# Patient Record
Sex: Male | Born: 1985 | Race: White | Hispanic: No | Marital: Single | Smoking: Former smoker
Health system: Southern US, Community
[De-identification: ages and names within clinical notes are randomized; demographics above are authoritative.]

## PROBLEM LIST (undated history)

## (undated) DIAGNOSIS — F1120 Opioid dependence, uncomplicated: Secondary | ICD-10-CM

## (undated) HISTORY — DX: Opioid dependence, uncomplicated: F11.20

---
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
## 2006-03-08 ENCOUNTER — Emergency Department (HOSPITAL_COMMUNITY): Admission: EM | Admit: 2006-03-08 | Discharge: 2006-03-08 | Payer: Self-pay | Admitting: Emergency Medicine

## 2006-03-11 ENCOUNTER — Emergency Department (HOSPITAL_COMMUNITY): Admission: EM | Admit: 2006-03-11 | Discharge: 2006-03-11 | Payer: Self-pay | Admitting: Emergency Medicine

## 2006-10-28 ENCOUNTER — Emergency Department (HOSPITAL_COMMUNITY): Admission: EM | Admit: 2006-10-28 | Discharge: 2006-10-28 | Payer: Self-pay | Admitting: Emergency Medicine

## 2006-12-16 ENCOUNTER — Emergency Department (HOSPITAL_COMMUNITY): Admission: EM | Admit: 2006-12-16 | Discharge: 2006-12-16 | Payer: Self-pay | Admitting: Family Medicine

## 2009-05-28 IMAGING — CR DG THORACIC SPINE 2V
3 series · 3 of 3 positions shown · non-contrast
Comparison: none

HISTORY: Back pain after lifting heavy box

THORACIC SPINE 3 VIEWS:
12 pairs of ribs.
Vertebral body and disc space heights maintained.
No fracture or subluxation.
Bone mineralization normal.
Paraspinal lines normal.

[view not recorded (1 of 3)]
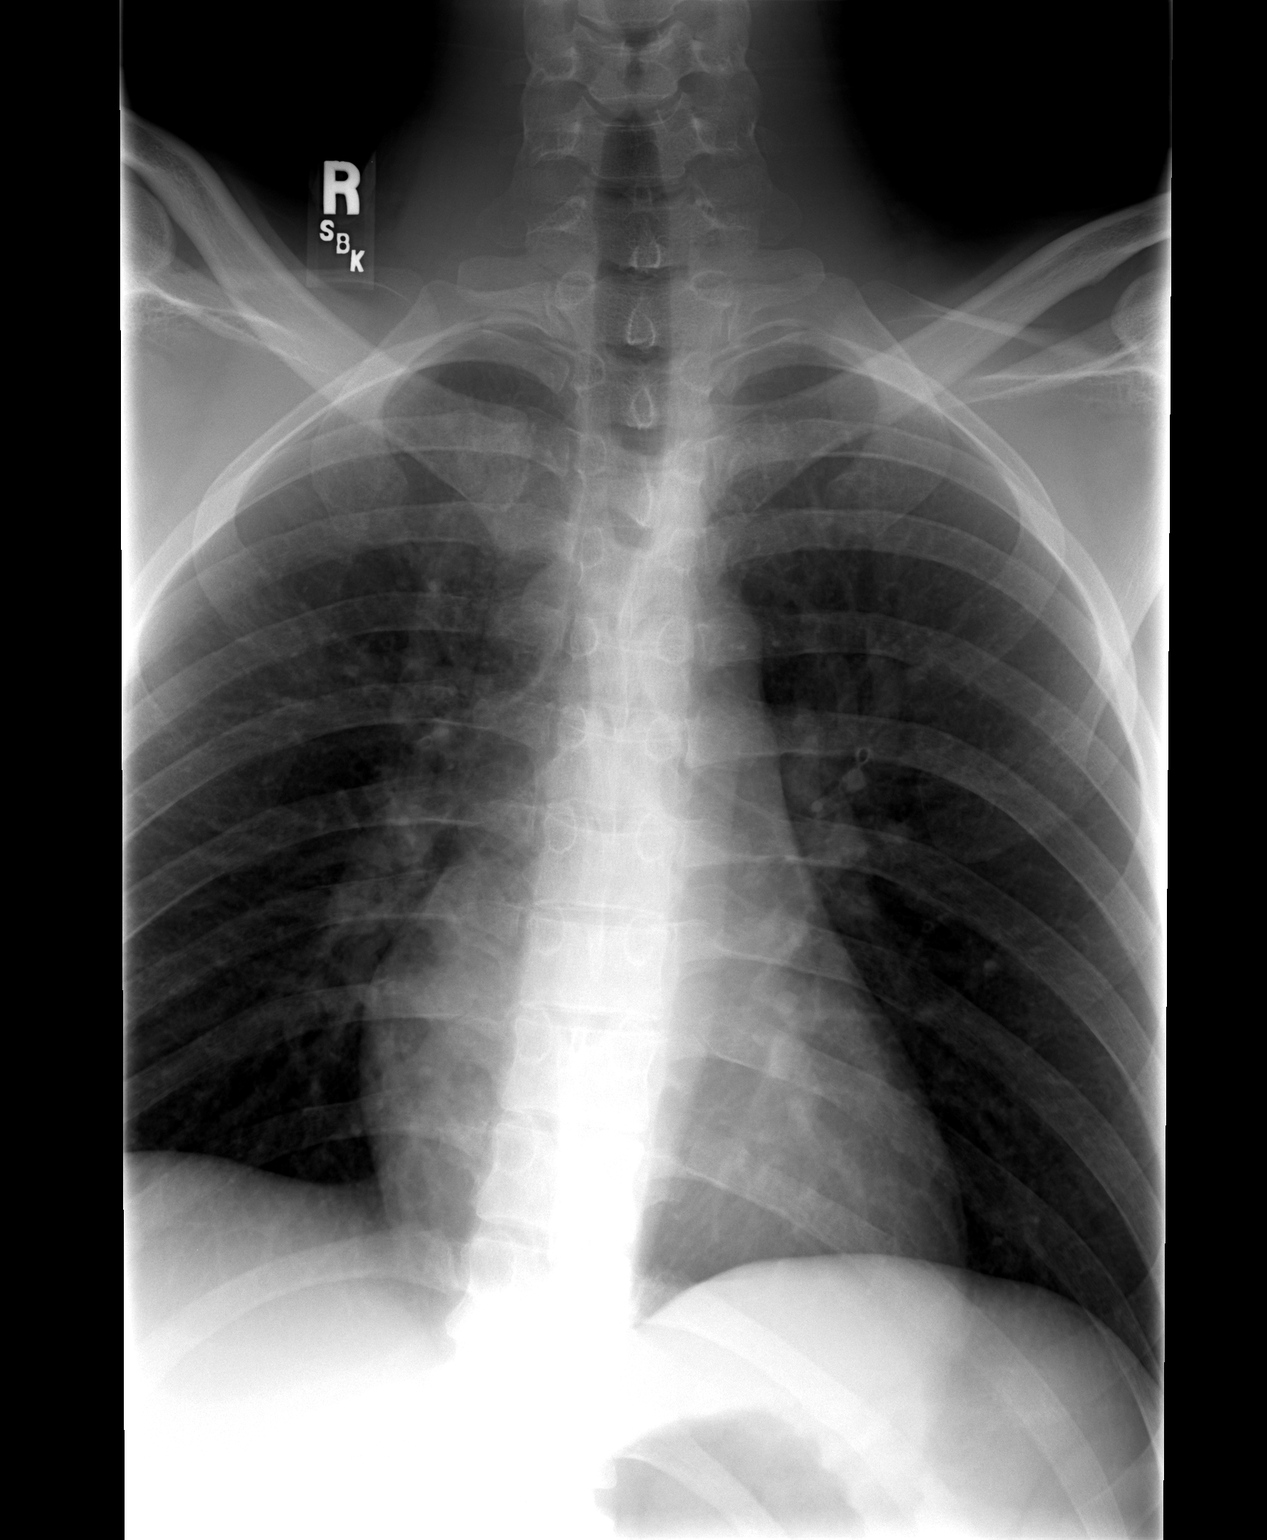

[view not recorded (2 of 3)]
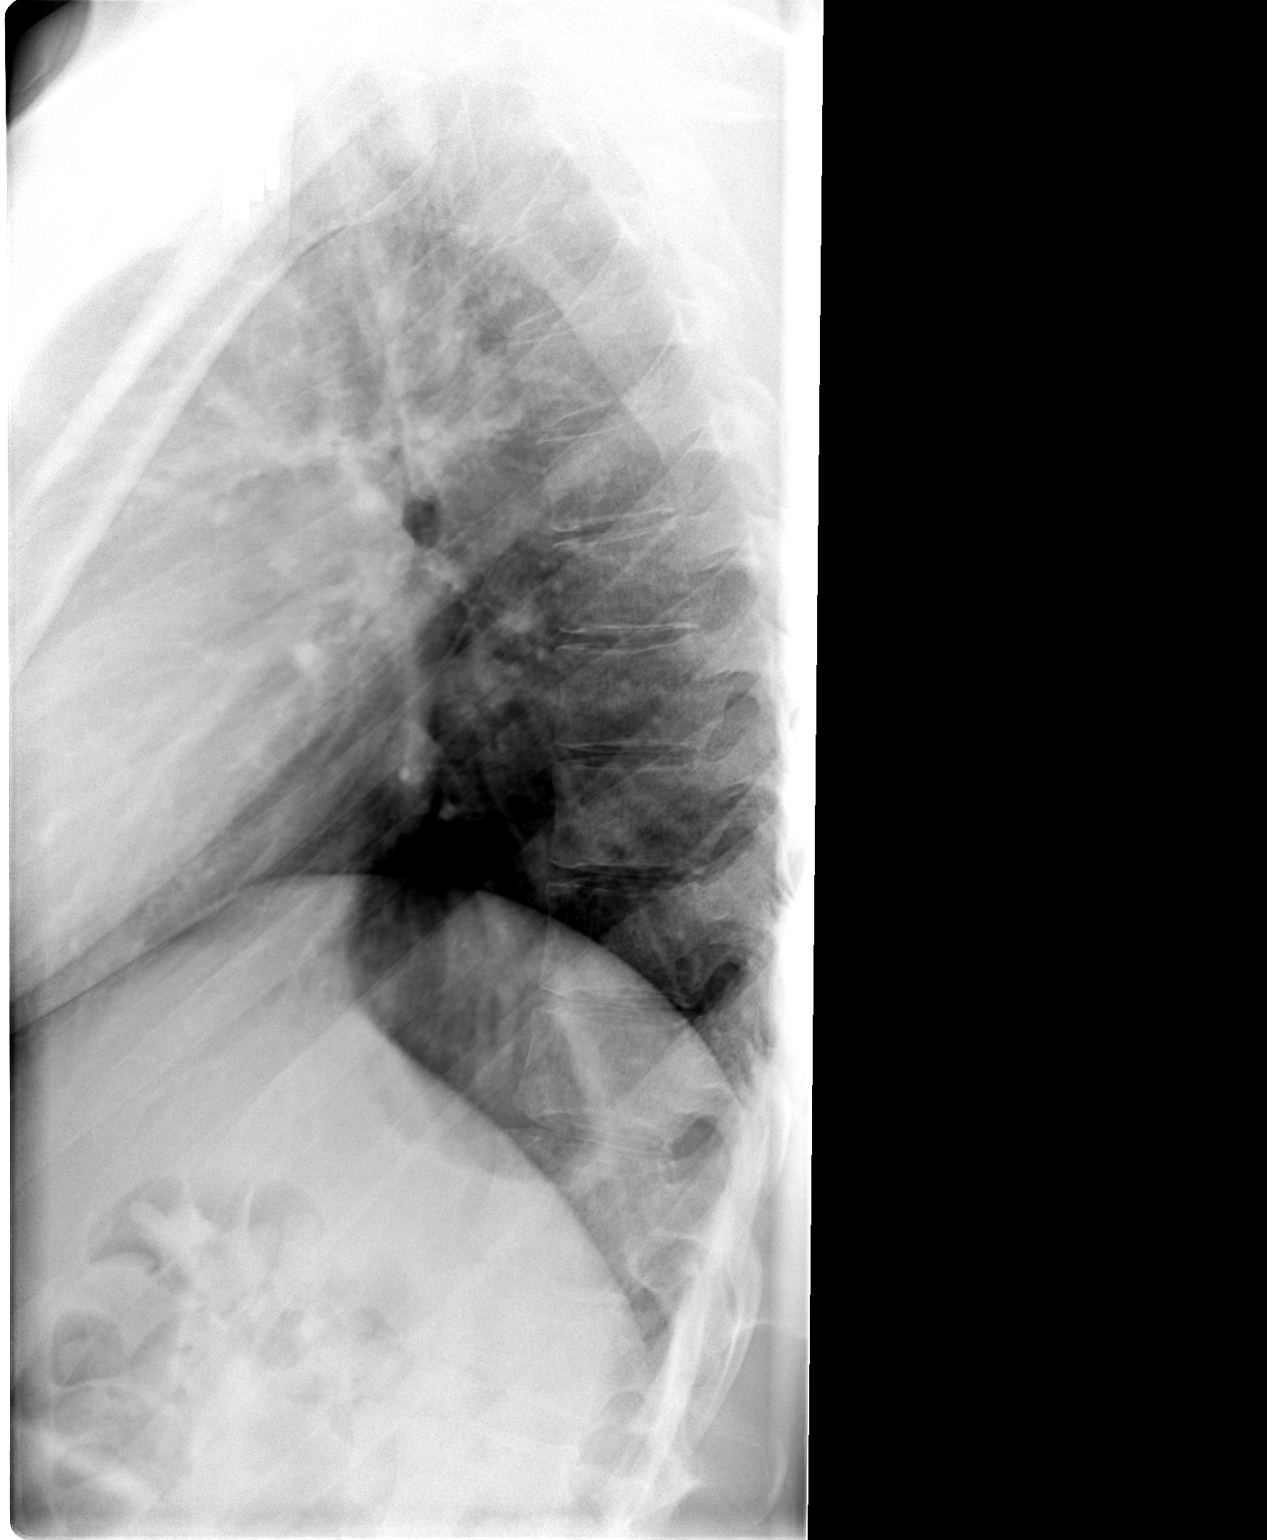

[view not recorded (3 of 3)]
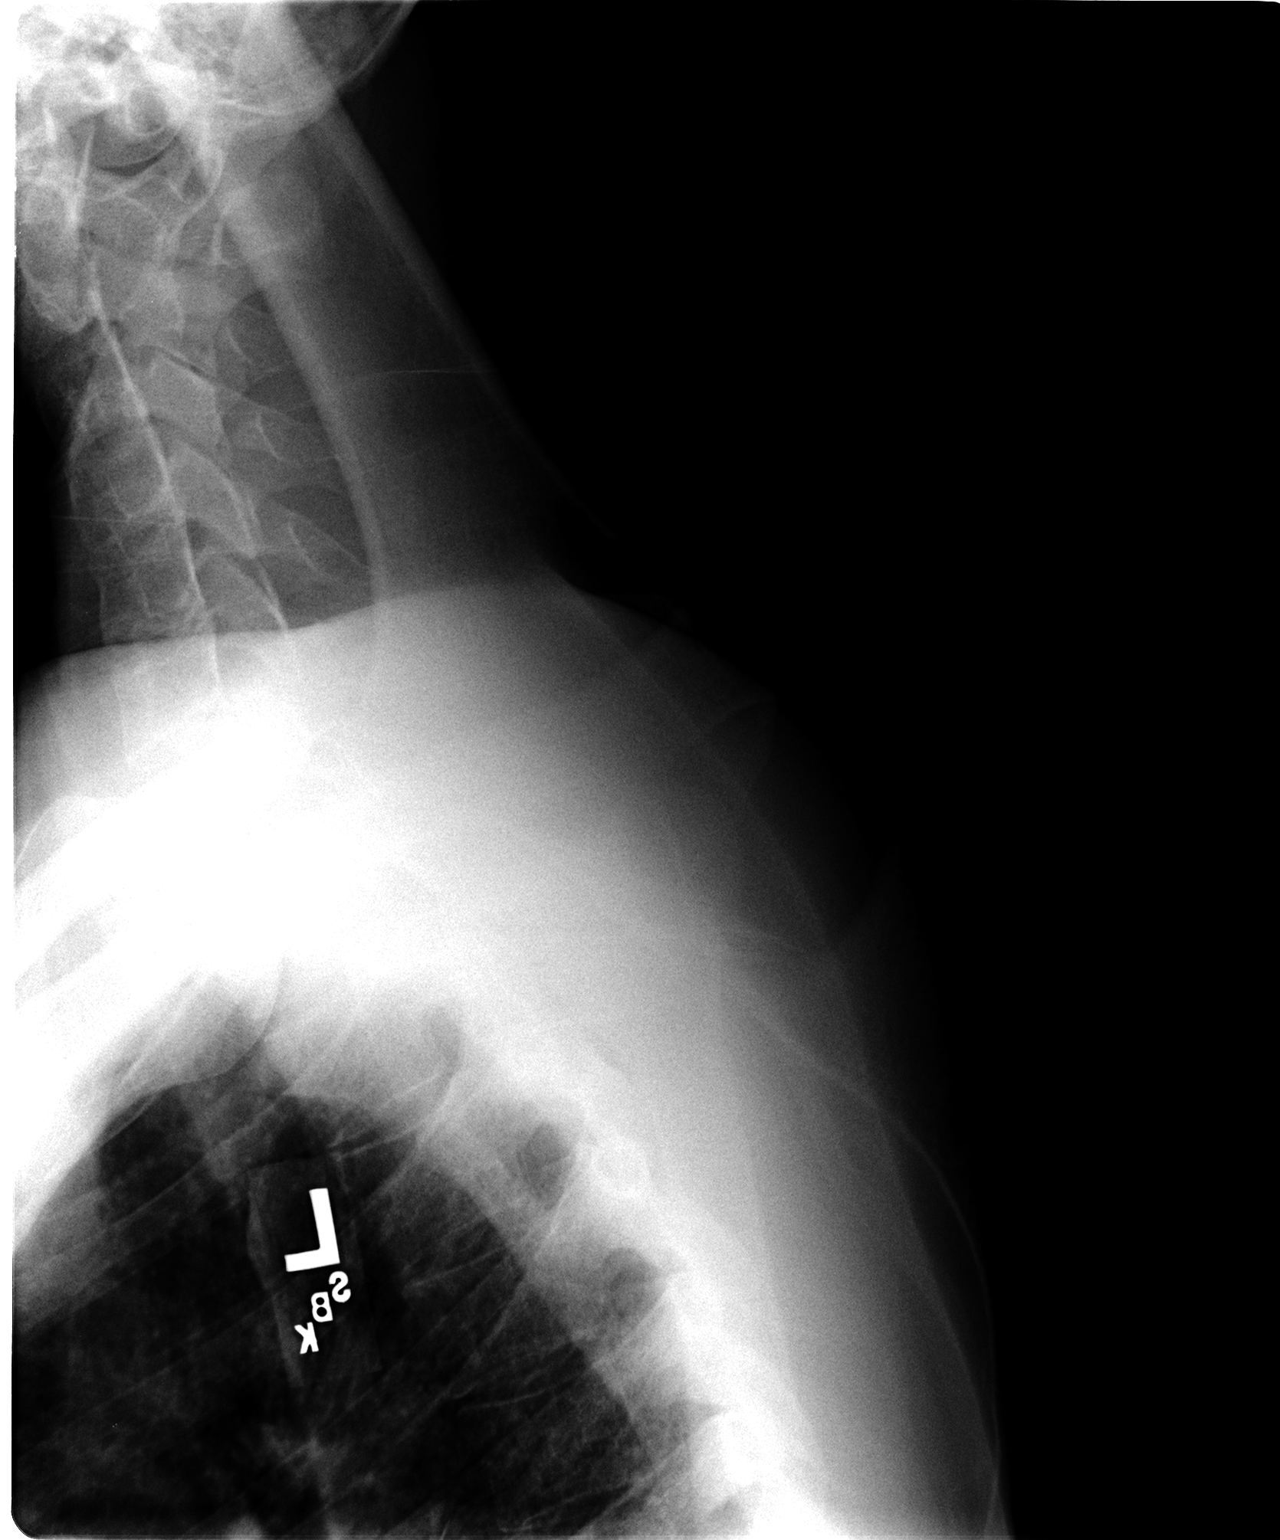

[3 of 3 positions shown; findings below may reference images not displayed]

IMPRESSION: No acute bony abnormality.

## 2011-02-28 LAB — STREP A DNA PROBE: Group A Strep Probe: NEGATIVE

## 2011-02-28 LAB — RAPID STREP SCREEN (MED CTR MEBANE ONLY): Streptococcus, Group A Screen (Direct): NEGATIVE

## 2012-06-24 ENCOUNTER — Encounter (HOSPITAL_COMMUNITY): Payer: Self-pay | Admitting: Emergency Medicine

## 2012-06-24 ENCOUNTER — Emergency Department (HOSPITAL_COMMUNITY)
Admission: EM | Admit: 2012-06-24 | Discharge: 2012-06-24 | Disposition: A | Discharge status: Home / Self Care | Payer: Self-pay | Attending: Emergency Medicine | Admitting: Emergency Medicine

## 2012-06-24 ENCOUNTER — Emergency Department (HOSPITAL_COMMUNITY): Payer: Self-pay

## 2012-06-24 DIAGNOSIS — Y929 Unspecified place or not applicable: Secondary | ICD-10-CM | POA: Insufficient documentation

## 2012-06-24 DIAGNOSIS — S61409A Unspecified open wound of unspecified hand, initial encounter: Secondary | ICD-10-CM | POA: Insufficient documentation

## 2012-06-24 DIAGNOSIS — W260XXA Contact with knife, initial encounter: Secondary | ICD-10-CM | POA: Insufficient documentation

## 2012-06-24 DIAGNOSIS — Z87891 Personal history of nicotine dependence: Secondary | ICD-10-CM | POA: Insufficient documentation

## 2012-06-24 DIAGNOSIS — Z23 Encounter for immunization: Secondary | ICD-10-CM | POA: Insufficient documentation

## 2012-06-24 DIAGNOSIS — Y9389 Activity, other specified: Secondary | ICD-10-CM | POA: Insufficient documentation

## 2012-06-24 DIAGNOSIS — IMO0002 Reserved for concepts with insufficient information to code with codable children: Secondary | ICD-10-CM

## 2012-06-24 MED ORDER — MELOXICAM 15 MG PO TABS: 15.0000 mg | ORAL_TABLET | Freq: Every day | ORAL | Status: DC

## 2012-06-24 MED ORDER — TETANUS-DIPHTH-ACELL PERTUSSIS 5-2.5-18.5 LF-MCG/0.5 IM SUSP
0.5000 mL | Freq: Once | INTRAMUSCULAR | Status: AC
  Administered 2012-06-24: 0.5 mL via INTRAMUSCULAR
  Filled 2012-06-24: qty 0.5

## 2012-06-24 NOTE — ED Provider Notes (Signed)
History     CSN: [id number]  Arrival date & time 06/24/12  1122   First MD Initiated Contact with Patient 06/24/12 1133      Chief Complaint  Patient presents with  . Extremity Laceration    (Consider location/radiation/quality/duration/timing/severity/associated sxs/prior treatment) HPI  27 year old male who presents the emergency department with chief complaint of laceration to left hand.  He is right-hand dominant.  Patient states he he was handling a knife today when his brother reached to pull the knife away. Knife new and uncontaminated. Patient ended up getting caught in  webbing between the first and second left digit.  He is not up-to-date on his tetanus vaccination.  Patient complains of pain.  He is able to move all of his digits.  He denies any numbness.  History reviewed. No pertinent past medical history.  History reviewed. No pertinent past surgical history.  History reviewed. No pertinent family history.  History  Substance Use Topics  . Smoking status: Former Games developer  . Smokeless tobacco: Not on file  . Alcohol Use: No      Review of Systems Ten systems reviewed and are negative for acute change, except as noted in the HPI.   Allergies  Review of patient's allergies indicates no known allergies.  Home Medications   Current Outpatient Rx  Name  Route  Sig  Dispense  Refill  . methadone (DOLOPHINE) 10 MG/5ML solution   Oral   Take 140 mg by mouth every morning.           BP 132/82  Pulse 98  Temp(Src) 98 F (36.7 C) (Oral)  Resp 18  SpO2 100%  Physical Exam  Physical Exam  Nursing note and vitals reviewed. Constitutional: He appears well-developed and well-nourished. No distress.  HENT:  Head: Normocephalic and atraumatic.  Eyes: Conjunctivae normal are normal. No scleral icterus.  Neck: Normal range of motion. Neck supple.  Cardiovascular: Normal rate, regular rhythm and normal heart sounds.   Pulmonary/Chest: Effort normal and breath  sounds normal. No respiratory distress.  Abdominal: Soft. There is no tenderness.  Musculoskeletal: He exhibits no edema.  Neurological: He is alert.  Skin: Skin is warm and dry. He is not diaphoretic.  3 cm horseshoe-shaped laceration with mild avulsion  In the webbing between fingers 1 and 2 of the left hand.no tendon involvement.  Full-strength in all fingers.  No limited range of motion Neurovascularly intact.   Psychiatric: His behavior is normal.    ED Course  Procedures (including critical care time) LACERATION REPAIR Performed by: Arthor Captain Authorized by: Arthor Captain Consent: Verbal consent obtained. Risks and benefits: risks, benefits and alternatives were discussed Consent given by: patient Patient identity confirmed: provided demographic data Prepped and Draped in normal sterile fashion Wound explored  Laceration Location: left hand  Laceration Length: 3 cm  No Foreign Bodies seen or palpated  Anesthesia: local infiltration  Local anesthetic: lidocaine 2 % w/o epinephrine  Anesthetic total: 5 ml  Irrigation method: syringe Amount of cleaning: standard  Skin closure: 4.0 prolene     Number of sutures: 11  Technique: RI, SI  Patient tolerance: Patient tolerated the procedure well with no immediate complications.  Labs Reviewed - No data to display Dg Hand Complete Left  06/24/2012  *RADIOLOGY REPORT*  Clinical Data: Hand laceration.  Evaluate for foreign body.  LEFT HAND - COMPLETE 3+ VIEW  Comparison: None.  Findings: No acute bony abnormality.  Specifically, no fracture, subluxation, or dislocation.  Soft tissues are  intact.  No radiopaque foreign body.  IMPRESSION: No acute bony abnormality.   Original Report Authenticated By: Charlett Nose, M.D.      1. Laceration       MDM  Tdap booster given.Pressure irrigation performed. Laceration occurred < 8 hours prior to repair which was well tolerated. Pt has no co morbidities to effect normal wound  healing. Discussed suture home care w pt and answered questions. Pt to f-u for wound check and suture removal in 8 days. Pt is hemodynamically stable w no complaints prior to dc.           Arthor Captain, PA-C 06/27/12 1610

## 2012-06-24 NOTE — ED Notes (Signed)
Pt and brother were playing with a knife.  Pt cut left pointer finger "to the bone".

## 2012-06-25 NOTE — Progress Notes (Signed)
During [facility] ED 06/24/12 visit pt was seen by [facility] liaison  Pt offered services to assist with finding a [place] self pay provider, resources & health reform information  

## 2012-06-27 NOTE — ED Provider Notes (Signed)
Medical screening examination/treatment/procedure(s) were conducted as a shared visit with non-physician practitioner(s) and myself.  I personally evaluated the patient during the encounter.  No obvious tendon injury. Full range of motion of hand  Donnetta Hutching, MD 06/27/12 2044

## 2012-07-03 ENCOUNTER — Emergency Department (HOSPITAL_COMMUNITY)
Admission: EM | Admit: 2012-07-03 | Discharge: 2012-07-03 | Disposition: A | Discharge status: Home / Self Care | Payer: Self-pay | Attending: Emergency Medicine | Admitting: Emergency Medicine

## 2012-07-03 ENCOUNTER — Encounter (HOSPITAL_COMMUNITY): Payer: Self-pay | Admitting: Emergency Medicine

## 2012-07-03 DIAGNOSIS — Z87891 Personal history of nicotine dependence: Secondary | ICD-10-CM | POA: Insufficient documentation

## 2012-07-03 DIAGNOSIS — Z4801 Encounter for change or removal of surgical wound dressing: Secondary | ICD-10-CM | POA: Insufficient documentation

## 2012-07-03 DIAGNOSIS — Z79899 Other long term (current) drug therapy: Secondary | ICD-10-CM | POA: Insufficient documentation

## 2012-07-03 DIAGNOSIS — Z4802 Encounter for removal of sutures: Secondary | ICD-10-CM

## 2012-07-03 DIAGNOSIS — Z87828 Personal history of other (healed) physical injury and trauma: Secondary | ICD-10-CM | POA: Insufficient documentation

## 2012-07-03 NOTE — ED Provider Notes (Signed)
History    This chart was scribed for non-physician practitioner working with Ward Givens, MD by Leone Payor, ED Scribe. This patient was seen in room WTR8/WTR8 and the patient's care was started at 1645.   CSN: [id number]  Arrival date & time 07/03/12  1645   First MD Initiated Contact with Patient 07/03/12 1717      Chief Complaint  Patient presents with  . Suture / Staple Removal     Patient is a 27 y.o. male presenting with suture removal. The history is provided by the patient. No language interpreter was used.  Suture / Staple Removal  The sutures were placed 7 to 10 days ago. Treatments since wound repair include antibiotic ointment use. There has been no drainage from the wound. There is no redness present. There is no swelling present. The pain has improved. He has no difficulty moving the affected extremity or digit.    SPIRO AUSBORN is a 27 y.o. male who presents to the Emergency Department requesting suture removal today. Pt reports the laceration occurred 8 days ago via a knife cut for which he had 7 stitches placed. Reports taking tylenol and meloxicam for pain as needed. Reports using neosporin. He has associated aching to the affected area but drainage. He denies fever, nausea.   Pt is a former smoker but denies alcohol use.  History reviewed. No pertinent past medical history.  History reviewed. No pertinent past surgical history.  No family history on file.  History  Substance Use Topics  . Smoking status: Former Games developer  . Smokeless tobacco: Not on file  . Alcohol Use: No      Review of Systems  Constitutional: Negative for fever.  Gastrointestinal: Negative for nausea.  Skin: Positive for wound (healing laceration).  All other systems reviewed and are negative.    Allergies  Review of patient's allergies indicates no known allergies.  Home Medications   Current Outpatient Rx  Name  Route  Sig  Dispense  Refill  . acetaminophen (TYLENOL) 500 MG  tablet   Oral   Take 1,000 mg by mouth every 6 (six) hours as needed for pain.         . meloxicam (MOBIC) 15 MG tablet   Oral   Take 1 tablet (15 mg total) by mouth daily.   10 tablet   0   . methadone (DOLOPHINE) 10 MG/5ML solution   Oral   Take 140 mg by mouth every morning.           BP 116/69  Pulse 99  Temp(Src) 98.4 F (36.9 C) (Oral)  SpO2 100%  Physical Exam  Nursing note and vitals reviewed. Constitutional: He is oriented to person, place, and time. He appears well-developed and well-nourished. No distress.  HENT:  Head: Normocephalic and atraumatic.  Eyes: EOM are normal.  Neck: Neck supple. No tracheal deviation present.  Cardiovascular: Normal rate.   Pulmonary/Chest: Effort normal. No respiratory distress.  Musculoskeletal: Normal range of motion.  Neurological: He is alert and oriented to person, place, and time.  Skin: Skin is warm and dry.  Good sensation and movement, flexor and extensor, to the left hand.   Psychiatric: He has a normal mood and affect. His behavior is normal.    ED Course  Procedures (including critical care time)  DIAGNOSTIC STUDIES: Oxygen Saturation is 100% on room air, normal by my interpretation.    COORDINATION OF CARE: 5:46 PM Discussed discharge plan with pt and pt agreed to  plan. Also advised pt to follow up and pt agreed.     Labs Reviewed - No data to display No results found.   No diagnosis found.     MDM  Suture removal.  Steri strips applied with bandaid.  No infection noted.     I personally performed the services described in this documentation, which was scribed in my presence. The recorded information has been reviewed and is accurate.    Remi Haggard, NP 07/04/12 [phone]

## 2012-07-03 NOTE — ED Notes (Signed)
MD at bedside. 

## 2012-07-03 NOTE — ED Notes (Signed)
Pt states "I here for my suture removal"

## 2012-07-03 NOTE — Discharge Instructions (Signed)
Erik Cox we took the sutures out today with no problem.  You can wear the steri strips as needed.  Use antibacterial ointment as well.  Return to the ER for severe pain or signs of infection.   Suture Removal Your caregiver has removed your sutures today. If skin adhesive strips were applied at the time of suturing, or applied following removal of the sutures today, they will begin to peel off in a couple more days. If skin adhesive strips remain after 14 days, they may be removed. HOME CARE INSTRUCTIONS   Change any bandages (dressings) at least once a day or as directed by your caregiver. If the bandage sticks, soak it off with warm, soapy water.  Wash the area with soap and water to remove all the cream or ointment (if you were instructed to use any) 2 times a day. Rinse off the soap and pat the area dry with a clean towel.  Reapply cream or ointment as directed by your caregiver. This will help prevent infection and keep the bandage from sticking.  Keep the wound area dry and clean. If the bandage becomes wet, dirty, or develops a bad smell, change it as soon as possible.  Only take over-the-counter or prescription medicines for pain, discomfort, or fever as directed by your caregiver.  Use sunscreen when out in the sun. New scars become sunburned easily.  Return to your caregivers office in in 7 days or as directed to have your sutures removed. You may need a tetanus shot if:  You cannot remember when you had your last tetanus shot.  You have never had a tetanus shot.  The injury broke your skin. If you got a tetanus shot, your arm may swell, get red, and feel warm to the touch. This is common and not a problem. If you need a tetanus shot and you choose not to have one, there is a rare chance of getting tetanus. Sickness from tetanus can be serious. SEEK IMMEDIATE MEDICAL CARE IF:   There is redness, swelling, or increasing pain in the wound.  Pus is coming from the wound.  An  unexplained oral temperature above 102 F (38.9 C) develops.  You notice a bad smell coming from the wound or dressing.  The wound breaks open (edges not staying together) after sutures have been removed. Document Released: 01/23/2001 Document Revised: 07/23/2011 Document Reviewed: 03/24/2007 [facility] Patient Information 2013 [place], [place].

## 2012-07-06 NOTE — ED Provider Notes (Signed)
Medical screening examination/treatment/procedure(s) were performed by non-physician practitioner and as supervising physician I was immediately available for consultation/collaboration.  Juliet Rude. Rubin Payor, MD 07/06/12 2248

## 2012-07-21 ENCOUNTER — Ambulatory Visit: Payer: Self-pay | Admitting: Internal Medicine

## 2012-07-21 VITALS — BP 152/82 | HR 121 | Temp 98.0°F | Resp 16 | Ht 73.0 in | Wt 248.0 lb

## 2012-07-21 DIAGNOSIS — R1013 Epigastric pain: Secondary | ICD-10-CM

## 2012-07-21 DIAGNOSIS — K921 Melena: Secondary | ICD-10-CM

## 2012-07-21 DIAGNOSIS — K5900 Constipation, unspecified: Secondary | ICD-10-CM

## 2012-07-21 DIAGNOSIS — F1120 Opioid dependence, uncomplicated: Secondary | ICD-10-CM

## 2012-07-21 DIAGNOSIS — R55 Syncope and collapse: Secondary | ICD-10-CM

## 2012-07-21 LAB — POCT CBC
Hemoglobin: 14.1 g/dL (ref 14.1–18.1)
Lymph, poc: 2.2 (ref 0.6–3.4)
MCH, POC: 29.1 pg (ref 27–31.2)
MCHC: 31.8 g/dL (ref 31.8–35.4)
MID (cbc): 0.6 (ref 0–0.9)
MPV: 9.8 fL (ref 0–99.8)
POC Granulocyte: 10.7 — AB (ref 2–6.9)
POC LYMPH PERCENT: 16.6 %L (ref 10–50)
POC MID %: 4.5 %M (ref 0–12)
Platelet Count, POC: 327 10*3/uL (ref 142–424)
RDW, POC: 12.7 %
WBC: 13.5 10*3/uL — AB (ref 4.6–10.2)

## 2012-07-21 MED ORDER — HYDROCORTISONE 2.5 % RE CREA: TOPICAL_CREAM | Freq: Two times a day (BID) | RECTAL | Status: DC

## 2012-07-21 NOTE — Patient Instructions (Signed)
Your diagnosis for today is vasovagal syncope -Google this and read about it  This is a fairly common response to straining and having pain  Continue an aggressive dietary approach to constipation Use Miralax if needed anusol hc prescription for fissures Probiotics daily Exercise daily-one hour Avoid foods that are white Consider Zantac twice a day for 6 weeks for your dyspepsia/indigestion

## 2012-07-21 NOTE — Progress Notes (Signed)
  Subjective:    Patient ID: Erik Cox, male    DOB: 1986/01/12, 27 y.o.   MRN: [id number]  HPI 27 year old male presents with:  Constipation x 8 days; states his medication(methodone) causes some constipation; BM every 4 days is typical Took stool softener last night; prune juice//his own fiber  Went to use toilet today and next thing he knows "woke up on the floor".  Hit head on wall. loss of consciousness, does not remember how long he was unconscious Took about 20 seconds to get back onto toilet; he had accomplished a bowel movement ; wiped- "black sludge" presently feels a little dizzy, a little nauseous; mild abdominal discomfort ; bowel rectal discomfort --history of occasional bright red blood after straining for the past 2 months he feels some tenesmus/  History of indigestion x 4 months; doesn't take a lot of ibuprofen; no coffee; no alcohol; uses lots of tums//lots of indigestion/occasionally wakes at night with epigastric burning    Review of Systems  no genitourinary problems/symptoms No cardiac or pulmonary problems/symptoms/     Objective:   Physical Exam BP 152/82  Pulse 121  Temp(Src) 98 F (36.7 C) (Oral)  Resp 16  Ht 6\' 1"  (1.854 m)  Wt 248 lb (112.492 kg)  BMI 32.73 kg/m2  SpO2 99% Very anxious Heart regular with a rate of 105/slows to the 80s if he deep breathes and and relaxes Lungs clear Abdomen soft /bowel sounds very active /no hepatosplenomegaly /tender to deep palpation left lower quadrant right lower quadrant and left upper quadrant without masses /negative rebound   rectal exam with a healed fissure at 12:00 Digital exam reveals no masses/stool brown Hemosure= positive Results for orders placed in visit on 07/21/12  IFOBT (OCCULT BLOOD)      Result Value Range   IFOBT Positive    POCT CBC      Result Value Range   WBC 13.5 (*) 4.6 - 10.2 K/uL   Lymph, poc 2.2  0.6 - 3.4   POC LYMPH PERCENT 16.6  10 - 50 %L   MID (cbc) 0.6  0 - 0.9   POC  MID % 4.5  0 - 12 %M   POC Granulocyte 10.7 (*) 2 - 6.9   Granulocyte percent 78.9  37 - 80 %G   RBC 4.85  4.69 - 6.13 M/uL   Hemoglobin 141 (*) 14.1 - 18.1 g/dL   HCT, POC 69.6  29.5 - 53.7 %   MCV 91.6  80 - 97 fL   MCH, POC 29.1  27 - 31.2 pg   MCHC 31.8  31.8 - 35.4 g/dL   RDW, POC 28.4     Platelet Count, POC 327  142 - 424 K/uL   MPV 9.8  0 - 99.8 fL        Assessment & Plan:  Problem #1 chronic constipation Problem #2 vasovagal syncope Problem #3 healing anal fissure Problem #4 methadone maintenance Problem #5 dyspepsia postprandial/? Ulcer/? Gastritis/? Irritable bowel syndrome  Meds ordered this encounter  Medications  . hydrocortisone (ANUSOL-HC) 2.5 % rectal cream    Sig: Place rectally 2 (two) times daily. For fissure    Dispense:  30 g    Refill:  2    -  OTC Prilosec 40 for next month and then recheck   -  Probiotics 3 month   -  MiraLax when necessary/improved diet/daily fiber/increase exercise/weight loss

## 2012-08-18 ENCOUNTER — Ambulatory Visit: Payer: Self-pay | Admitting: Emergency Medicine

## 2012-08-18 VITALS — BP 122/72 | HR 106 | Temp 98.1°F | Resp 16 | Ht 72.25 in | Wt 243.6 lb

## 2012-08-18 DIAGNOSIS — R109 Unspecified abdominal pain: Secondary | ICD-10-CM

## 2012-08-18 DIAGNOSIS — K5900 Constipation, unspecified: Secondary | ICD-10-CM

## 2012-08-18 LAB — POCT CBC
Granulocyte percent: 82.6 %G — AB (ref 37–80)
Hemoglobin: 15 g/dL (ref 14.1–18.1)
MCH, POC: 30.4 pg (ref 27–31.2)
MCV: 90.4 fL (ref 80–97)
MID (cbc): 0.3 (ref 0–0.9)
Platelet Count, POC: 345 10*3/uL (ref 142–424)
RBC: 4.93 M/uL (ref 4.69–6.13)
WBC: 11.1 10*3/uL — AB (ref 4.6–10.2)

## 2012-08-18 LAB — COMPREHENSIVE METABOLIC PANEL
AST: 14 U/L (ref 0–37)
Albumin: 4.7 g/dL (ref 3.5–5.2)
Alkaline Phosphatase: 78 U/L (ref 39–117)
Calcium: 10.1 mg/dL (ref 8.4–10.5)
Chloride: 99 mEq/L (ref 96–112)
Glucose, Bld: 97 mg/dL (ref 70–99)
Potassium: 4.2 mEq/L (ref 3.5–5.3)
Sodium: 135 mEq/L (ref 135–145)
Total Protein: 7.7 g/dL (ref 6.0–8.3)

## 2012-08-18 NOTE — Patient Instructions (Addendum)
Gastritis, Adult Gastritis is soreness and swelling (inflammation) of the lining of the stomach. Gastritis can develop as a sudden onset (acute) or long-term (chronic) condition. If gastritis is not treated, it can lead to stomach bleeding and ulcers. CAUSES  Gastritis occurs when the stomach lining is weak or damaged. Digestive juices from the stomach then inflame the weakened stomach lining. The stomach lining may be weak or damaged due to viral or bacterial infections. One common bacterial infection is the Helicobacter pylori infection. Gastritis can also result from excessive alcohol consumption, taking certain medicines, or having too much acid in the stomach.  SYMPTOMS  In some cases, there are no symptoms. When symptoms are present, they may include:  Pain or a burning sensation in the upper abdomen.  Nausea.  Vomiting.  An uncomfortable feeling of fullness after eating. DIAGNOSIS  Your caregiver may suspect you have gastritis based on your symptoms and a physical exam. To determine the cause of your gastritis, your caregiver may perform the following:  Blood or stool tests to check for the H pylori bacterium.  Gastroscopy. A thin, flexible tube (endoscope) is passed down the esophagus and into the stomach. The endoscope has a light and camera on the end. Your caregiver uses the endoscope to view the inside of the stomach.  Taking a tissue sample (biopsy) from the stomach to examine under a microscope. TREATMENT  Depending on the cause of your gastritis, medicines may be prescribed. If you have a bacterial infection, such as an H pylori infection, antibiotics may be given. If your gastritis is caused by too much acid in the stomach, H2 blockers or antacids may be given. Your caregiver may recommend that you stop taking aspirin, ibuprofen, or other nonsteroidal anti-inflammatory drugs (NSAIDs). HOME CARE INSTRUCTIONS  Only take over-the-counter or prescription medicines as directed by  your caregiver.  If you were given antibiotic medicines, take them as directed. Finish them even if you start to feel better.  Drink enough fluids to keep your urine clear or pale yellow.  Avoid foods and drinks that make your symptoms worse, such as:  Caffeine or alcoholic drinks.  Chocolate.  Peppermint or mint flavorings.  Garlic and onions.  Spicy foods.  Citrus fruits, such as oranges, lemons, or limes.  Tomato-based foods such as sauce, chili, salsa, and pizza.  Fried and fatty foods.  Eat small, frequent meals instead of large meals. SEEK IMMEDIATE MEDICAL CARE IF:   You have black or dark red stools.  You vomit blood or material that looks like coffee grounds.  You are unable to keep fluids down.  Your abdominal pain gets worse.  You have a fever.  You do not feel better after 1 week.  You have any other questions or concerns. MAKE SURE YOU:  Understand these instructions.  Will watch your condition.  Will get help right away if you are not doing well or get worse. Document Released: 04/24/2001 Document Revised: 10/30/2011 Document Reviewed: 06/13/2011 [facility] Patient Information 2013 [place], [place]. Constipation, Adult Constipation is when a person has fewer than 3 bowel movements a week; has difficulty having a bowel movement; or has stools that are dry, hard, or larger than normal. As people grow older, constipation is more common. If you try to fix constipation with medicines that make you have a bowel movement (laxatives), the problem may get worse. Long-term laxative use may cause the muscles of the colon to become weak. A low-fiber diet, not taking in enough fluids, and taking  certain medicines may make constipation worse. CAUSES   Certain medicines, such as antidepressants, pain medicine, iron supplements, antacids, and water pills.   Certain diseases, such as diabetes, irritable bowel syndrome (IBS), thyroid disease, or depression.   Not  drinking enough water.   Not eating enough fiber-rich foods.   Stress or travel.  Lack of physical activity or exercise.  Not going to the restroom when there is the urge to have a bowel movement.  Ignoring the urge to have a bowel movement.  Using laxatives too much. SYMPTOMS   Having fewer than 3 bowel movements a week.   Straining to have a bowel movement.   Having hard, dry, or larger than normal stools.   Feeling full or bloated.   Pain in the lower abdomen.  Not feeling relief after having a bowel movement. DIAGNOSIS  Your caregiver will take a medical history and perform a physical exam. Further testing may be done for severe constipation. Some tests may include:   A barium enema X-ray to examine your rectum, colon, and sometimes, your small intestine.  A sigmoidoscopy to examine your lower colon.  A colonoscopy to examine your entire colon. TREATMENT  Treatment will depend on the severity of your constipation and what is causing it. Some dietary treatments include drinking more fluids and eating more fiber-rich foods. Lifestyle treatments may include regular exercise. If these diet and lifestyle recommendations do not help, your caregiver may recommend taking over-the-counter laxative medicines to help you have bowel movements. Prescription medicines may be prescribed if over-the-counter medicines do not work.  HOME CARE INSTRUCTIONS   Increase dietary fiber in your diet, such as fruits, vegetables, whole grains, and beans. Limit high-fat and processed sugars in your diet, such as [place] fries, hamburgers, cookies, candies, and soda.   A fiber supplement may be added to your diet if you cannot get enough fiber from foods.   Drink enough fluids to keep your urine clear or pale yellow.   Exercise regularly or as directed by your caregiver.   Go to the restroom when you have the urge to go. Do not hold it.  Only take medicines as directed by your  caregiver. Do not take other medicines for constipation without talking to your caregiver first. SEEK IMMEDIATE MEDICAL CARE IF:   You have bright red blood in your stool.   Your constipation lasts for more than 4 days or gets worse.   You have abdominal or rectal pain.   You have thin, pencil-like stools.  You have unexplained weight loss. MAKE SURE YOU:   Understand these instructions.  Will watch your condition.  Will get help right away if you are not doing well or get worse. Document Released: 01/27/2004 Document Revised: 07/23/2011 Document Reviewed: 04/03/2011 [facility] Patient Information 2013 [place], [place].

## 2012-08-18 NOTE — Progress Notes (Signed)
[facility] [address], [place] [address] [phone]  Date:  08/18/2012   Name:  Erik Cox   DOB:  1985/09/26   MRN:  [id number]  PCP:  No primary provider on file.    Chief Complaint: Abdominal Pain   History of Present Illness:  Erik Cox is a 27 y.o. very pleasant male patient who presents with the following:  8 month history of constipation with marked worsening in past three months.  The patient has no complaint of blood, mucous, or pus in his stools.  Has frequent epigastric pain and heartburn that has improved with prilosec AND zantac.  No nausea or vomiting or hematemesis.  No fever or chills.  History of narcotic abuse with 3 year treatment with methadone.  Took miralax following his last visit with improvement but stopped the medication.  No improvement with over the counter medications or other home remedies. Denies other complaint or health concern today.   Patient Active Problem List  Diagnosis  . Long-term current use of methadone for opiate dependence    Past Medical History  Diagnosis Date  . Opiate dependence     No past surgical history on file.  History  Substance Use Topics  . Smoking status: Former Games developer  . Smokeless tobacco: Never Used  . Alcohol Use: No    Family History  Problem Relation Age of Onset  . Cancer Paternal Grandmother     No Known Allergies  Medication list has been reviewed and updated.  Current Outpatient Prescriptions on File Prior to Visit  Medication Sig Dispense Refill  . methadone (DOLOPHINE) 10 MG/5ML solution Take 140 mg by mouth every morning.       No current facility-administered medications on file prior to visit.    Review of Systems:  As per HPI, otherwise negative.    Physical Examination: Filed Vitals:   08/18/12 1359  BP: 122/72  Pulse: 106  Temp: 98.1 F (36.7 C)  Resp: 16   Filed Vitals:   08/18/12 1359  Height: 6' 0.25" (1.835 m)  Weight: 243 lb 9.6 oz  (110.496 kg)   Body mass index is 32.82 kg/(m^2). Ideal Body Weight: Weight in (lb) to have BMI = 25: 185.2   GEN: WDWN, NAD, Non-toxic, Alert & Oriented x 3 HEENT: Atraumatic, Normocephalic.  Ears and Nose: No external deformity. EXTR: No clubbing/cyanosis/edema NEURO: Normal gait.  PSYCH: Normally interactive. Conversant. Not depressed or anxious appearing.  Calm demeanor.  ABDOMEN:  Tender epigastrium   No masses or megaly.    Assessment and Plan: Epigastric abdominal pain Constipation prilosec daily Continue miralax Follow up based on labs   Signed,  Phillips Odor, MD

## 2012-08-20 NOTE — Addendum Note (Signed)
Addended by: Carmelina Dane on: 08/20/2012 08:25 PM   Modules accepted: Orders

## 2012-09-10 ENCOUNTER — Ambulatory Visit (HOSPITAL_COMMUNITY): Payer: Self-pay

## 2012-09-10 ENCOUNTER — Ambulatory Visit (HOSPITAL_COMMUNITY): Admission: RE | Admit: 2012-09-10 | Payer: Self-pay | Source: Ambulatory Visit

## 2014-12-05 IMAGING — CR DG HAND COMPLETE 3+V*L*
3 series · 3 of 3 positions shown · non-contrast
Comparison: None.

CLINICAL DATA: Hand laceration.  Evaluate for foreign body.

LEFT HAND - COMPLETE 3+ VIEW

[x hand pa left]
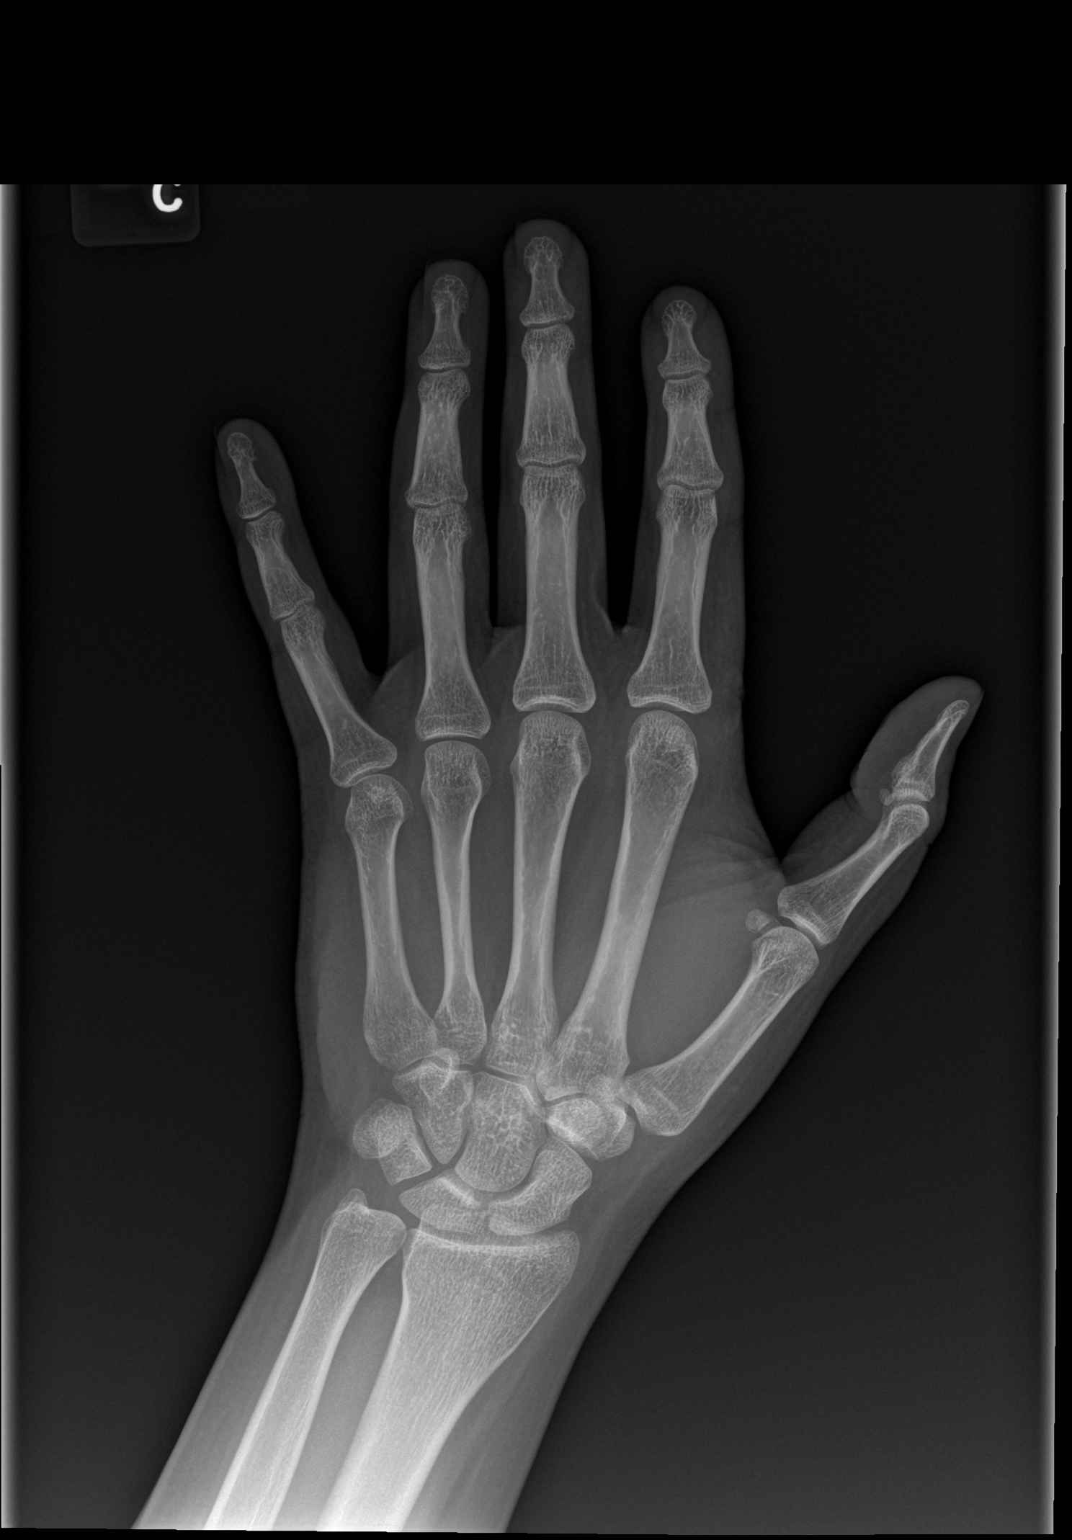

[x hand obl left]
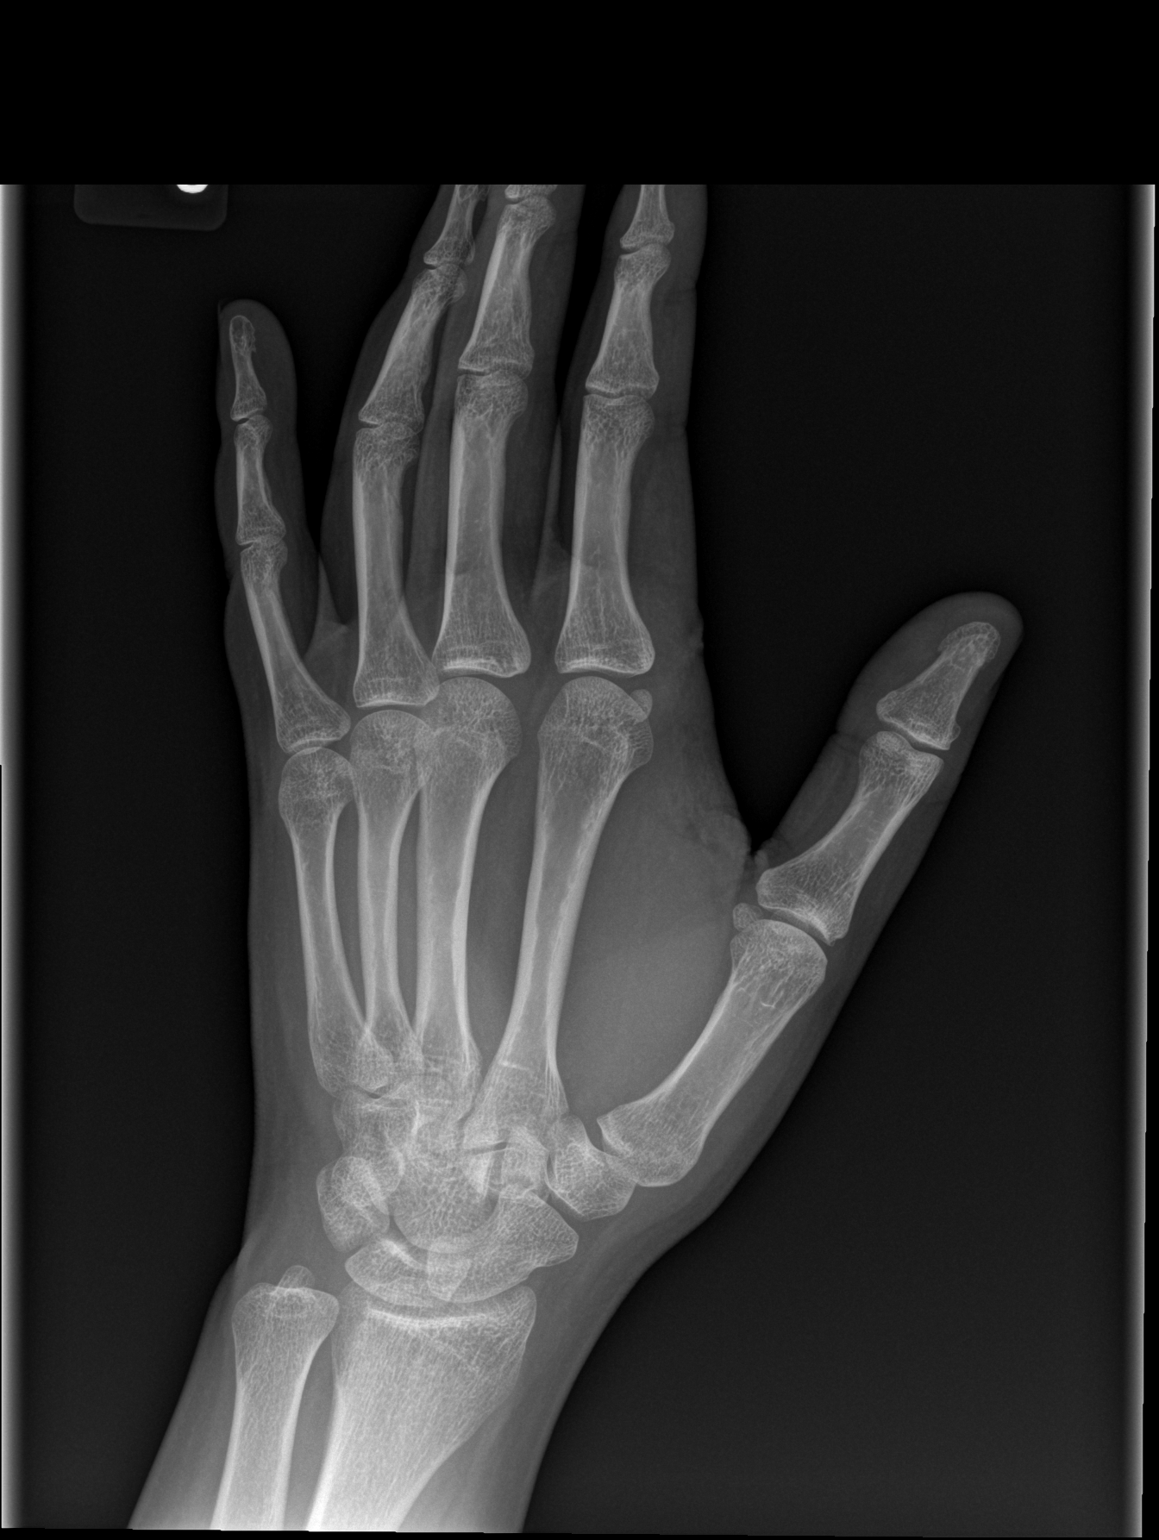

[x hand lat left]
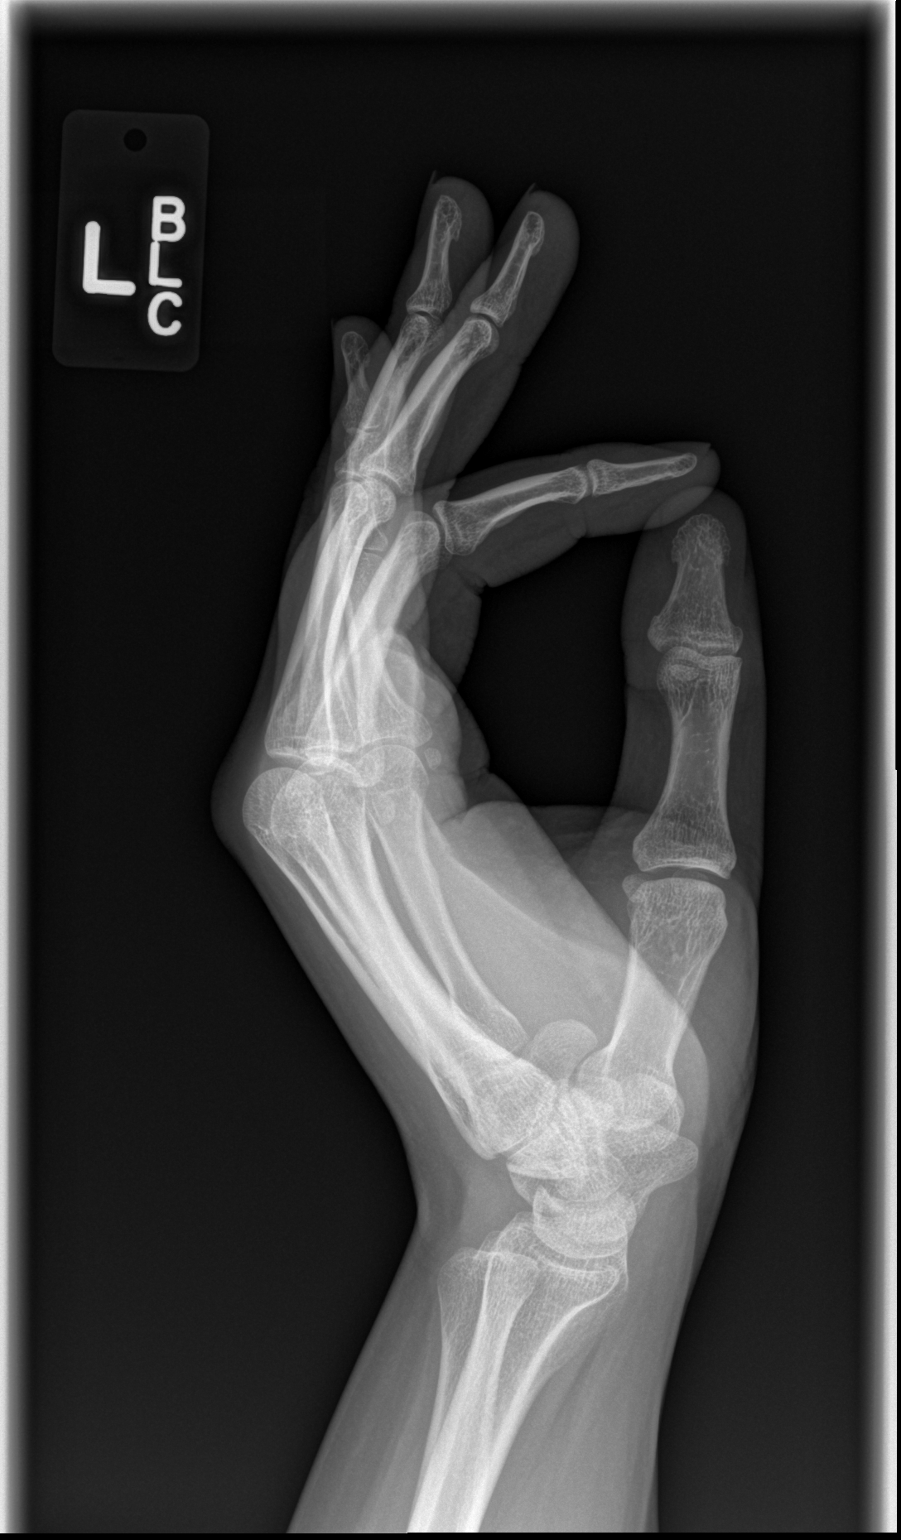

[3 of 3 positions shown; findings below may reference images not displayed]

FINDINGS: No acute bony abnormality.  Specifically, no fracture,
subluxation, or dislocation.  Soft tissues are intact.  No
radiopaque foreign body.
IMPRESSION: No acute bony abnormality.

## 2018-04-27 ENCOUNTER — Ambulatory Visit: Payer: Self-pay | Admitting: Family Medicine

## 2018-04-27 VITALS — BP 120/80 | HR 105 | Temp 98.5°F | Resp 18 | Wt 256.2 lb

## 2018-04-27 DIAGNOSIS — J302 Other seasonal allergic rhinitis: Secondary | ICD-10-CM

## 2018-04-27 DIAGNOSIS — H6980 Other specified disorders of Eustachian tube, unspecified ear: Secondary | ICD-10-CM

## 2018-04-27 DIAGNOSIS — H6592 Unspecified nonsuppurative otitis media, left ear: Secondary | ICD-10-CM

## 2018-04-27 MED ORDER — AMOXICILLIN-POT CLAVULANATE 875-125 MG PO TABS: 1.0000 | ORAL_TABLET | Freq: Two times a day (BID) | ORAL | 0 refills | Status: AC

## 2018-04-27 MED ORDER — PREDNISONE 10 MG (21) PO TBPK: ORAL_TABLET | ORAL | 0 refills | Status: AC

## 2018-04-27 MED ORDER — AZELASTINE HCL 0.1 % NA SOLN: 1.0000 | Freq: Two times a day (BID) | NASAL | 0 refills | Status: AC

## 2018-04-27 MED ORDER — CETIRIZINE HCL 10 MG PO TABS: 10.0000 mg | ORAL_TABLET | Freq: Every day | ORAL | 0 refills | Status: AC

## 2018-04-27 NOTE — Progress Notes (Signed)
Erik Cox is a 32 y.o. male who presents today with concerns of ear fullness for the last 7 days. He has attempted multiple over the counter treatments from pseudoephedrine to homeopathic options. He does report that the symptoms began after experiencing ear itching that prompted him to use a qtip on both ears- there was no relief with this method. Today here present with concerns of ear fullness on both sides and well as dizziness and headache. He does report a recent upper respiratory infection that he has not attempted to treat with any medications at this time. Wears glasses reports he has not seen eye provider in the last 2 years related to cost. Patient today had eye exam that was WNL. Patient also reports intermittent am dizziness and headache but denies blurred vision, photophobia/phonophobia, nausea, vomiting, or a history of headaches or migraines.    Review of Systems  Constitutional: Negative for chills, fever and malaise/fatigue.  HENT: Positive for ear pain. Negative for congestion, ear discharge, hearing loss, sinus pain, sore throat and tinnitus.   Eyes: Negative.  Negative for blurred vision, double vision, photophobia, pain, discharge and redness.  Respiratory: Negative for cough, sputum production and shortness of breath.   Cardiovascular: Negative.  Negative for chest pain.  Gastrointestinal: Negative for abdominal pain, diarrhea, nausea and vomiting.  Genitourinary: Negative for dysuria, frequency, hematuria and urgency.  Musculoskeletal: Negative for myalgias.  Skin: Negative.   Neurological: Positive for dizziness and headaches.  Endo/Heme/Allergies: Negative.   Psychiatric/Behavioral: Negative.     O: Vitals:   04/27/18 1210  BP: 120/80  Pulse: (!) 105  Resp: 18  Temp: 98.5 F (36.9 C)  SpO2: 98%     Physical Exam Vitals signs reviewed.  Constitutional:      Appearance: He is well-developed. He is not toxic-appearing.  HENT:     Head: Normocephalic.   Right Ear: Hearing, tympanic membrane, ear canal and external ear normal.     Left Ear: Hearing, ear canal and external ear normal. A middle ear effusion is present.     Ears:     Comments: Initial exam- bilateral thick brown wax blocking TM visualization After bilateral flush both TM visualized- left ear effusion- mild    Nose: Rhinorrhea present.     Mouth/Throat:     Pharynx: Uvula midline.  Neck:     Musculoskeletal: Normal range of motion and neck supple.  Cardiovascular:     Rate and Rhythm: Normal rate and regular rhythm.     Pulses: Normal pulses.     Heart sounds: Normal heart sounds.  Pulmonary:     Effort: Pulmonary effort is normal.     Breath sounds: Normal breath sounds.  Abdominal:     General: Bowel sounds are normal.     Palpations: Abdomen is soft.  Musculoskeletal: Normal range of motion.  Lymphadenopathy:     Head:     Right side of head: No submental or submandibular adenopathy.     Left side of head: No submental or submandibular adenopathy.     Cervical: No cervical adenopathy.  Neurological:     Mental Status: He is alert and oriented to person, place, and time.      A: 1. Eustachian tube dysfunction, unspecified laterality   2. Seasonal allergies   3. MEE (middle ear effusion), left    P: Discussed exam findings, diagnosis etiology and medication use and indications reviewed with patient. Follow- Up and discharge instructions provided. No emergent/urgent issues found on exam.  Patient verbalized understanding of information provided and agrees with plan of care (POC), all questions answered.  Advised last year by ENT to start allergy regimen for seasonal allergies. Patient reports he does not believe that he has allergies so never started the medication. Patient education on seasonal allergies and typical signs and symptoms and medication treatment plan initiated today. Discussed the risk of eustachian tube dysfunction as a cause for ear pain,  fullness and dizziness issues- will initiate steroid and abx for mild left ear effusion seen and symptoms of fullness- bilateral cerumen impaction also removed this visit. Patient advised if symptoms become worse or are unimproved to seek care with PCP or ENT for comprehensive evaluation.  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   1. Eustachian tube dysfunction, unspecified laterality - amoxicillin-clavulanate (AUGMENTIN) 875-125 MG tablet; Take 1 tablet by mouth 2 (two) times daily for 7 days. - predniSONE (STERAPRED UNI-PAK 21 TAB) 10 MG (21) TBPK tablet; Take 6 tablets on day 1 in the morning with breakfast then each morning take 1 less tablet until taper complete. (6,5,4,3,2,1)  2. Seasonal allergies - cetirizine (ZYRTEC) 10 MG tablet; Take 1 tablet (10 mg total) by mouth daily. - azelastine (ASTELIN) 0.1 % nasal spray; Place 1 spray into both nostrils 2 (two) times daily. Use in each nostril as directed  3. MEE (middle ear effusion), left - amoxicillin-clavulanate (AUGMENTIN) 875-125 MG tablet; Take 1 tablet by mouth 2 (two) times daily for 7 days. - predniSONE (STERAPRED UNI-PAK 21 TAB) 10 MG (21) TBPK tablet; Take 6 tablets on day 1 in the morning with breakfast then each morning take 1 less tablet until taper complete. (6,5,4,3,2,1)

## 2018-04-27 NOTE — Patient Instructions (Signed)
Eustachian Tube Dysfunction The eustachian tube connects the middle ear to the back of the nose. It regulates air pressure in the middle ear by allowing air to move between the ear and nose. It also helps to drain fluid from the middle ear space. When the eustachian tube does not function properly, air pressure, fluid, or both can build up in the middle ear. Eustachian tube dysfunction can affect one or both ears. What are the causes? This condition happens when the eustachian tube becomes blocked or cannot open normally. This may result from:  Ear infections.  Colds and other upper respiratory infections.  Allergies.  Irritation, such as from cigarette smoke or acid from the stomach coming up into the esophagus (gastroesophageal reflux).  Sudden changes in air pressure, such as from descending in an airplane.  Abnormal growths in the nose or throat, such as nasal polyps, tumors, or enlarged tissue at the back of the throat (adenoids).  What increases the risk? This condition may be more likely to develop in people who smoke and people who are overweight. Eustachian tube dysfunction may also be more likely to develop in children, especially children who have:  Certain birth defects of the mouth, such as cleft palate.  Large tonsils and adenoids.  What are the signs or symptoms? Symptoms of this condition may include:  A feeling of fullness in the ear.  Ear pain.  Clicking or popping noises in the ear.  Ringing in the ear.  Hearing loss.  Loss of balance.  Symptoms may get worse when the air pressure around you changes, such as when you travel to an area of high elevation or fly on an airplane. How is this diagnosed? This condition may be diagnosed based on:  Your symptoms.  A physical exam of your ear, nose, and throat.  Tests, such as those that measure: ? The movement of your eardrum (tympanogram). ? Your hearing (audiometry).  How is this treated? Treatment  depends on the cause and severity of your condition. If your symptoms are mild, you may be able to relieve your symptoms by moving air into ("popping") your ears. If you have symptoms of fluid in your ears, treatment may include:  Decongestants.  Antihistamines.  Nasal sprays or ear drops that contain medicines that reduce swelling (steroids).  In some cases, you may need to have a procedure to drain the fluid in your eardrum (myringotomy). In this procedure, a small tube is placed in the eardrum to:  Drain the fluid.  Restore the air in the middle ear space.  Follow these instructions at home:  Take over-the-counter and prescription medicines only as told by your health care provider.  Use techniques to help pop your ears as recommended by your health care provider. These may include: ? Chewing gum. ? Yawning. ? Frequent, forceful swallowing. ? Closing your mouth, holding your nose closed, and gently blowing as if you are trying to blow air out of your nose.  Do not do any of the following until your health care provider approves: ? Travel to high altitudes. ? Fly in airplanes. ? Work in a pressurized cabin or room. ? Scuba dive.  Keep your ears dry. Dry your ears completely after showering or bathing.  Do not smoke.  Keep all follow-up visits as told by your health care provider. This is important. Contact a health care provider if:  Your symptoms do not go away after treatment.  Your symptoms come back after treatment.  You are   unable to pop your ears.  You have: ? A fever. ? Pain in your ear. ? Pain in your head or neck. ? Fluid draining from your ear.  Your hearing suddenly changes.  You become very dizzy.  You lose your balance. This information is not intended to replace advice given to you by your health care provider. Make sure you discuss any questions you have with your health care provider. Document Released: 05/27/2015 Document Revised: 10/06/2015  Document Reviewed: 05/19/2014 Elsevier Interactive Patient Education  2018 [employer]  

## 2020-11-03 ENCOUNTER — Other Ambulatory Visit: Payer: Self-pay

## 2020-11-03 ENCOUNTER — Encounter (HOSPITAL_COMMUNITY): Payer: Self-pay | Admitting: *Deleted

## 2020-11-03 ENCOUNTER — Emergency Department (HOSPITAL_COMMUNITY): Payer: Self-pay

## 2020-11-03 ENCOUNTER — Emergency Department (HOSPITAL_COMMUNITY)
Admission: EM | Admit: 2020-11-03 | Discharge: 2020-11-03 | Disposition: A | Discharge status: Home / Self Care | Payer: Self-pay | Attending: Emergency Medicine | Admitting: Emergency Medicine

## 2020-11-03 DIAGNOSIS — Z20822 Contact with and (suspected) exposure to covid-19: Secondary | ICD-10-CM | POA: Insufficient documentation

## 2020-11-03 DIAGNOSIS — R000 Tachycardia, unspecified: Secondary | ICD-10-CM | POA: Insufficient documentation

## 2020-11-03 DIAGNOSIS — R197 Diarrhea, unspecified: Secondary | ICD-10-CM | POA: Insufficient documentation

## 2020-11-03 DIAGNOSIS — R1013 Epigastric pain: Secondary | ICD-10-CM | POA: Insufficient documentation

## 2020-11-03 DIAGNOSIS — R112 Nausea with vomiting, unspecified: Secondary | ICD-10-CM | POA: Insufficient documentation

## 2020-11-03 DIAGNOSIS — Z87891 Personal history of nicotine dependence: Secondary | ICD-10-CM | POA: Insufficient documentation

## 2020-11-03 LAB — CBC
HCT: 52.4 % — ABNORMAL HIGH (ref 39.0–52.0)
Hemoglobin: 18.4 g/dL — ABNORMAL HIGH (ref 13.0–17.0)
MCH: 31.3 pg (ref 26.0–34.0)
MCHC: 35.1 g/dL (ref 30.0–36.0)
MCV: 89.3 fL (ref 80.0–100.0)
Platelets: 220 10*3/uL (ref 150–400)
RBC: 5.87 MIL/uL — ABNORMAL HIGH (ref 4.22–5.81)
RDW: 11.9 % (ref 11.5–15.5)
WBC: 13.8 10*3/uL — ABNORMAL HIGH (ref 4.0–10.5)
nRBC: 0 % (ref 0.0–0.2)

## 2020-11-03 LAB — COMPREHENSIVE METABOLIC PANEL
ALT: 17 U/L (ref 0–44)
AST: 18 U/L (ref 15–41)
Albumin: 4.8 g/dL (ref 3.5–5.0)
Alkaline Phosphatase: 54 U/L (ref 38–126)
Anion gap: 9 (ref 5–15)
BUN: 17 mg/dL (ref 6–20)
CO2: 29 mmol/L (ref 22–32)
Calcium: 9.7 mg/dL (ref 8.9–10.3)
Chloride: 102 mmol/L (ref 98–111)
Creatinine, Ser: 0.7 mg/dL (ref 0.61–1.24)
GFR, Estimated: >60 mL/min (ref >60)
Glucose, Bld: 126 mg/dL — ABNORMAL HIGH (ref 70–99)
Potassium: 4.5 mmol/L (ref 3.5–5.1)
Sodium: 140 mmol/L (ref 135–145)
Total Bilirubin: 1.5 mg/dL — ABNORMAL HIGH (ref 0.3–1.2)
Total Protein: 8 g/dL (ref 6.5–8.1)

## 2020-11-03 LAB — URINALYSIS, ROUTINE W REFLEX MICROSCOPIC
Bilirubin Urine: NEGATIVE
Glucose, UA: NEGATIVE mg/dL
Hgb urine dipstick: NEGATIVE
Ketones, ur: 20 mg/dL — AB
Leukocytes,Ua: NEGATIVE
Nitrite: NEGATIVE
Protein, ur: NEGATIVE mg/dL
Specific Gravity, Urine: 1.023 (ref 1.005–1.030)
pH: 7 (ref 5.0–8.0)

## 2020-11-03 LAB — LIPASE, BLOOD: Lipase: 28 U/L (ref 11–51)

## 2020-11-03 LAB — RESP PANEL BY RT-PCR (FLU A&B, COVID) ARPGX2
Influenza A by PCR: NEGATIVE
Influenza B by PCR: NEGATIVE
SARS Coronavirus 2 by RT PCR: NEGATIVE

## 2020-11-03 MED ORDER — SODIUM CHLORIDE 0.9 % IV BOLUS
1000.0000 mL | Freq: Once | INTRAVENOUS | Status: AC
  Administered 2020-11-03: 1000 mL via INTRAVENOUS

## 2020-11-03 MED ORDER — LIDOCAINE VISCOUS HCL 2 % MT SOLN
15.0000 mL | Freq: Once | OROMUCOSAL | Status: AC
  Administered 2020-11-03: 15 mL via ORAL
  Filled 2020-11-03: qty 15

## 2020-11-03 MED ORDER — LACTATED RINGERS IV BOLUS
1000.0000 mL | Freq: Once | INTRAVENOUS | Status: AC
  Administered 2020-11-03: 1000 mL via INTRAVENOUS

## 2020-11-03 MED ORDER — IOHEXOL 300 MG/ML  SOLN
100.0000 mL | Freq: Once | INTRAMUSCULAR | Status: AC | PRN
  Administered 2020-11-03: 100 mL via INTRAVENOUS

## 2020-11-03 MED ORDER — ONDANSETRON 4 MG PO TBDP
4.0000 mg | ORAL_TABLET | Freq: Once | ORAL | Status: AC | PRN
  Administered 2020-11-03: 4 mg via ORAL
  Filled 2020-11-03: qty 1

## 2020-11-03 MED ORDER — ONDANSETRON 4 MG PO TBDP: 4.0000 mg | ORAL_TABLET | Freq: Three times a day (TID) | ORAL | 0 refills | Status: AC | PRN

## 2020-11-03 MED ORDER — SODIUM CHLORIDE (PF) 0.9 % IJ SOLN
INTRAMUSCULAR | Status: AC
  Filled 2020-11-03: qty 50

## 2020-11-03 MED ORDER — ALUM & MAG HYDROXIDE-SIMETH 200-200-20 MG/5ML PO SUSP
30.0000 mL | Freq: Once | ORAL | Status: AC
  Administered 2020-11-03: 30 mL via ORAL
  Filled 2020-11-03: qty 30

## 2020-11-03 NOTE — ED Notes (Addendum)
Pt's family member continues to roam hallways and use staff bathrooms despite counseling from two different ED nurses that he would need to stay inside the pt's room, as well as ask "what colors do the RN's wear here."

## 2020-11-03 NOTE — ED Notes (Signed)
Informed patient's visitor that once he leaves the room he can not return-family member stated he was going to get water and then he will stay in room

## 2020-11-03 NOTE — ED Notes (Signed)
Pt's family member has been asked to leave by pt. Family member has left the ED at this time.

## 2020-11-03 NOTE — ED Notes (Signed)
This nurse entered the pt's room, pt's family member asked "what about a urinalysis?" This nurse asked the pt if he had provided a urine sample into the urinal, which the pt stated he had not. The pt's family member then asked "what about a cup?!" This nurse explained to the pt and family member that once the pt provides a urine sample, to use the call light, and this nurse will obtain the sample, in a sterile cup, and send it to the lab. Pt and family member expressed understanding.

## 2020-11-03 NOTE — ED Notes (Addendum)
This nurse entered the pt's room to hang IV fluids ordered. The pt's family member became agitated that he was told "out front" that he needed to stop leaving the pt's room. This nurse stated to the pt's family member that she did not bear witness to the exchange "out front" and he has been out of the pt's room frequently in the hallway etc. The pt's family member became agitated stating, "this is a power grab that's all this is." The pt's family member then stated to this nurse, "I know who you are, I know the personality." This nurse then stated to the pt's family member that his comments were unnecessary and exited the exam room. PA-C Placido Sou notified and aware.

## 2020-11-03 NOTE — ED Notes (Addendum)
This nurse entered the pt's room, given the pt had called out using the call light. Pt's family member stated that the pt needed his bed adjusted and that said family member had "looked everywhere and can't figure out how to do it." This nurse stated to the family member, "no worries, that is an easy fix" and adjusted the pt's bed. As this nurse was leaving the pt's room, the family member asked this nurse, "How many beds are in this ER." This nurse stated to the family member that she did not understand his question. The family member then questioned this nurse again asking, how many beds are in this ER?" This nurse stated to the family member that it is a 45 bed ED, with a waiting room and EMS traffic. The family member then questioned the nurse again, stating, "there are no pt's in the waiting room." This nurse then stated to the family member, that she was unsure of how many pt's were in the waiting room given this nurse has patients in what is considered the back of the ED, and that she is also taking care of said family member's son. This nurse then asked the family member, if he had any additional questions or concerns, to which the family member stated he did not.

## 2020-11-03 NOTE — ED Notes (Signed)
Pt sprite and crackers and able to keep it down without difficulty.

## 2020-11-03 NOTE — ED Notes (Signed)
Pt to CT at this time.

## 2020-11-03 NOTE — ED Notes (Signed)
Pt and family member counseled on call light usage. This nurse readjusted pt's bed, as per requested by family member and pt.

## 2020-11-03 NOTE — ED Notes (Signed)
This nurse entered the pt's room to Covid swab the pt as ordered by ED PA-C/ The pt's family member stated, "this is such a scam." This nurse obtained covid swab and covid swab sent to the lab at this time.

## 2020-11-03 NOTE — ED Notes (Signed)
Logan PA made aware of patients vital signs at discharge. PA informed patient to administer tylenol and return for any worsening symptoms. Patient verbalizes understanding and states father will be picking patient up. Patient left ambulatory and in NAD.

## 2020-11-03 NOTE — ED Provider Notes (Signed)
[facility]PT Provider Note   CSN: [id number] Arrival date & time: 11/03/20  [id number]     History Chief Complaint  Patient presents with   Abdominal Pain   Emesis   Diarrhea    Erik Cox is a 35 y.o. male.  HPI  Patient is a 35 year old male with a medical history as noted below.  He presents to the emergency department with abdominal pain, nausea, vomiting, as well as diarrhea.  Symptoms started around 3 AM this morning.  He states that he woke up feeling upper abdominal pain and then began experiencing his nausea/vomiting/diarrhea.  Describes his vomit as clear with contents of his dinner last night.  Diarrhea is watery brown.  No hematemesis or hematochezia.  States that he ate cheese sticks, watermelon, as well as marinara sauce last night.  He does note that [place] 2 [place] ago and was experiencing a dry cough and scratchy throat which is since resolved.  Denies any other URI symptoms.  No chest pain or shortness of breath.       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 No pertinent surgical history.     Family History  Problem Relation Age of Onset   Cancer Paternal Grandmother     Social History   Tobacco Use   Smoking status: Former    Pack years: 0.00   Smokeless tobacco: Never  Substance Use Topics   Alcohol use: No   Drug use: No    Comment: on methadone    Home Medications Prior to Admission medications   Medication Sig Start Date End Date Taking? Authorizing Provider  ondansetron (ZOFRAN ODT) 4 MG disintegrating tablet Take 1 tablet (4 mg total) by mouth every 8 (eight) hours as needed for nausea or vomiting. 11/03/20  Yes Placido SouJoldersma, Colvin Blatt, PA-C  azelastine (ASTELIN) 0.1 % nasal spray Place 1 spray into both  nostrils 2 (two) times daily. Use in each nostril as directed 04/27/18   Zachery DauerGraham, Elysa, NP  cetirizine (ZYRTEC) 10 MG tablet Take 1 tablet (10 mg total) by mouth daily. 04/27/18   Zachery DauerGraham, Elysa, NP  methadone (DOLOPHINE) 10 MG/5ML solution Take 140 mg by mouth every morning.    [provider]  omeprazole (PRILOSEC) 10 MG capsule Take 10 mg by mouth daily.    [provider]  predniSONE (STERAPRED UNI-PAK 21 TAB) 10 MG (21) TBPK tablet Take 6 tablets on day 1 in the morning with breakfast then each morning take 1 less tablet until taper complete. (6,5,4,3,2,1) 04/27/18   Zachery DauerGraham, Elysa, NP  ranitidine (ZANTAC) 150 MG tablet Take 150 mg by mouth daily.    [provider]    Allergies    Patient has no known allergies.  Review of Systems   Review of Systems  All other systems reviewed and are negative. Ten systems reviewed and are negative for acute change, except as noted in the HPI.   Physical Exam Updated Vital Signs BP 98/82 (BP Location: Right Arm)   Pulse (!) 114   Temp 99 F (37.2 C) (Oral)   Resp 16   Ht 6\' 1"  (1.854 m)   Wt 104.3 kg   SpO2 100%   BMI 30.34 kg/m   Physical Exam Vitals and nursing note  reviewed.  Constitutional:      General: He is not in acute distress.    Appearance: Normal appearance. He is well-developed. He is not ill-appearing, toxic-appearing or diaphoretic.  HENT:     Head: Normocephalic and atraumatic.     Right Ear: External ear normal.     Left Ear: External ear normal.     Nose: Nose normal.     Mouth/Throat:     Mouth: Mucous membranes are moist.     Pharynx: Oropharynx is clear. No oropharyngeal exudate or posterior oropharyngeal erythema.  Eyes:     Extraocular Movements: Extraocular movements intact.  Cardiovascular:     Rate and Rhythm: Regular rhythm. Tachycardia present.     Pulses: Normal pulses.     Heart sounds: Normal heart sounds. No murmur heard.   No friction rub. No gallop.  Pulmonary:      Effort: Pulmonary effort is normal. No respiratory distress.     Breath sounds: Normal breath sounds. No stridor. No wheezing, rhonchi or rales.  Abdominal:     General: Abdomen is flat. There is no distension.     Palpations: Abdomen is soft.     Tenderness: There is abdominal tenderness in the epigastric area. Negative signs include Murphy's sign and McBurney's sign.     Comments: Abdomen is flat and soft.  Mild tenderness appreciated along the epigastrium.  No McBurney's point tenderness.  Negative Murphy sign.  Musculoskeletal:        General: Normal range of motion.     Cervical back: Normal range of motion and neck supple.     Lumbar back: Tenderness present.     Comments: Mild tenderness appreciated diffusely in the lumbar region.  Skin:    General: Skin is warm and dry.  Neurological:     General: No focal deficit present.     Mental Status: He is alert and oriented to person, place, and time.  Psychiatric:        Mood and Affect: Mood normal.        Behavior: Behavior normal.    ED Results / Procedures / Treatments   Labs (all labs ordered are listed, but only abnormal results are displayed) Labs Reviewed  COMPREHENSIVE METABOLIC PANEL - Abnormal; Notable for the following components:      Result Value   Glucose, Bld 126 (*)    Total Bilirubin 1.5 (*)    All other components within normal limits  CBC - Abnormal; Notable for the following components:   WBC 13.8 (*)    RBC 5.87 (*)    Hemoglobin 18.4 (*)    HCT 52.4 (*)    All other components within normal limits  URINALYSIS, ROUTINE W REFLEX MICROSCOPIC - Abnormal; Notable for the following components:   Ketones, ur 20 (*)    All other components within normal limits  RESP PANEL BY RT-PCR (FLU A&B, COVID) ARPGX2  LIPASE, BLOOD   EKG None  Radiology CT ABDOMEN PELVIS W CONTRAST  Result Date: 11/03/2020 CLINICAL DATA:  Abdominal pain since 3 and with nausea, diarrhea and fever. EXAM: CT ABDOMEN AND PELVIS WITH  CONTRAST TECHNIQUE: Multidetector CT imaging of the abdomen and pelvis was performed using the standard protocol following bolus administration of intravenous contrast. CONTRAST:  OMNIPAQUE IOHEXOL 300 MG/ML  SOLN COMPARISON:  None. FINDINGS: Lower chest: The lung bases are clear of acute process. No pleural effusion or pulmonary lesions. The heart is normal in size. No pericardial effusion. The distal esophagus and  aorta are unremarkable. Hepatobiliary: No hepatic lesions or intrahepatic biliary dilatation. The gallbladder is unremarkable. No common bile duct dilatation. The portal and hepatic veins are patent. Pancreas: No mass, inflammation or ductal dilatation. Spleen: Normal size.  No focal lesions. Adrenals/Urinary Tract: The adrenal glands are normal. No renal, ureteral or bladder calculi or mass. Stomach/Bowel: The stomach, duodenum, small bowel and colon are grossly normal. No acute inflammatory process, mass lesions or obstructive findings. The terminal ileum is normal. The appendix is normal. Vascular/Lymphatic: The aorta is normal in caliber. No dissection. The branch vessels are patent. The major venous structures are patent. No mesenteric or retroperitoneal mass or adenopathy. Small scattered lymph nodes are noted. Reproductive: The prostate gland and seminal vesicles are unremarkable. Other: No pelvic mass or adenopathy. No free pelvic fluid collections. No inguinal mass or adenopathy. No abdominal wall hernia or subcutaneous lesions. Musculoskeletal: No significant bony findings. IMPRESSION: No acute abdominal/pelvic findings, mass lesions or adenopathy. Electronically Signed   By: Rudie Meyer M.D.   On: 11/03/2020 13:42    Procedures Procedures   Medications Ordered in ED Medications  sodium chloride (PF) 0.9 % injection (  Not Given 11/03/20 1322)  ondansetron (ZOFRAN-ODT) disintegrating tablet 4 mg (4 mg Oral Given 11/03/20 0841)  sodium chloride 0.9 % bolus 1,000 mL (0 mLs  Intravenous Stopped 11/03/20 1153)  alum & mag hydroxide-simeth (MAALOX/MYLANTA) 200-200-20 MG/5ML suspension 30 mL (30 mLs Oral Given 11/03/20 1013)    And  lidocaine (XYLOCAINE) 2 % viscous mouth solution 15 mL (15 mLs Oral Given 11/03/20 1013)  lactated ringers bolus 1,000 mL (0 mLs Intravenous Stopped 11/03/20 1408)  iohexol (OMNIPAQUE) 300 MG/ML solution 100 mL (100 mLs Intravenous Contrast Given 11/03/20 1255)    ED Course  I have reviewed the triage vital signs and the nursing notes.  Pertinent labs & imaging results that were available during my care of the patient were reviewed by me and considered in my medical decision making (see chart for details).    MDM Rules/Calculators/A&P                          Pt is a 35 y.o. male who presents to the emergency department due to nausea/vomiting as well as upper abdominal pain.  Labs: CBC with leukocytosis of 13.8, RBCs of 5.7, hemoglobin of 18.4, hematocrit of 52.4.  Likely hemoconcentration. CMP with a glucose of 126. Lipase within normal limits at 28. Respiratory panel is negative. UA showing 20 ketones.  Imaging: CT scan of the abdomen and pelvis with contrast shows no acute abdominal/pelvic findings, mass lesions, or adenopathy.  I, Placido Sou, PA-C, personally reviewed and evaluated these images and lab results as part of my medical decision-making.  Unsure the source of the patient's symptoms.  Likely a viral gastroenteritis.  He does note that he was recently traveling prior to the onset of his symptoms and was having a dry cough and throat irritation earlier this week.  Respiratory panel was negative.  He did have some mild epigastric pain as well as a leukocytosis so I obtained a CT scan of the abdomen and pelvis which was reassuring.  Patient appears hemoconcentrated and was persistently tachycardic so he was given 2 L of IV fluids as well as Zofran for nausea/vomiting.  His heart rate has improved slightly but patient  is still tachycardic.  He states that he typically has a fast heart rate which appears consistent with her records.  Patient p.o. challenged successfully.  Feel that he is stable for discharge at this time and he is agreeable.  Will discharge on an additional course of Zofran.  Recommended PCP follow-up.  Return to the emergency department if his symptoms worsen.  His questions were answered and he was amicable at the time of discharge.  Patient discussed with my attending physician who is in agreement with the above plan.  Note: Portions of this report may have been transcribed using voice recognition software. Every effort was made to ensure accuracy; however, inadvertent computerized transcription errors may be present.   Final Clinical Impression(s) / ED Diagnoses Final diagnoses:  Non-intractable vomiting with nausea, unspecified vomiting type  Epigastric pain   Rx / DC Orders ED Discharge Orders          Ordered    ondansetron (ZOFRAN ODT) 4 MG disintegrating tablet  Every 8 hours PRN        11/03/20 1527             Placido Sou, PA-C 11/03/20 1540    Koleen Distance, MD 11/03/20 [phone]

## 2020-11-03 NOTE — ED Notes (Signed)
Pt's urine sent at this time. Pt family member continues to exit pt's exam room and roam hallways despite prior counseling on needing to stay in exam room.

## 2020-11-03 NOTE — Discharge Instructions (Addendum)
I am prescribing you a medication called Zofran.  This is a disintegrating tablet you can use up to 3 times a day for management of your nausea and vomiting.  Please only take this as prescribed.  Please only take this if you are experiencing nausea and vomiting that you cannot control.  Please continue to monitor your symptoms closely.  If you develop any new or worsening symptoms please come back to the emergency department.  It was a pleasure to meet you.

## 2020-11-03 NOTE — ED Notes (Signed)
PA-C at the bedside to re-evaluate.  

## 2020-11-03 NOTE — ED Triage Notes (Addendum)
Pt complains of abdominal cramps, emesis, diarrhea since last night. He said he ate a lot of watermelon and cheese sticks with marinara sauce.   Also adds that he came back from [place] 2 days ago and had a scratchy throat.

## 2020-11-03 NOTE — ED Notes (Signed)
PA-C at bedside to re-evaluate.
